# Patient Record
Sex: Female | Born: 1935 | Race: Black or African American | Hispanic: No
Health system: Southern US, Community
[De-identification: ages and names within clinical notes are randomized; demographics above are authoritative.]

## PROBLEM LIST (undated history)

## (undated) DIAGNOSIS — I10 Essential (primary) hypertension: Secondary | ICD-10-CM

---

## 2017-09-28 ENCOUNTER — Other Ambulatory Visit: Payer: Self-pay

## 2017-09-28 ENCOUNTER — Observation Stay: Payer: 59

## 2017-09-28 ENCOUNTER — Observation Stay
Admission: EM | Admit: 2017-09-28 | Discharge: 2017-09-29 | Disposition: A | Discharge status: Home / Self Care | Payer: 59 | Attending: Internal Medicine | Admitting: Internal Medicine

## 2017-09-28 ENCOUNTER — Observation Stay
Admit: 2017-09-28 | Discharge: 2017-09-28 | Disposition: A | Discharge status: Home / Self Care | Payer: 59 | Attending: Internal Medicine | Admitting: Internal Medicine

## 2017-09-28 ENCOUNTER — Encounter: Payer: Self-pay | Admitting: Internal Medicine

## 2017-09-28 DIAGNOSIS — M7121 Synovial cyst of popliteal space [Baker], right knee: Secondary | ICD-10-CM | POA: Insufficient documentation

## 2017-09-28 DIAGNOSIS — J90 Pleural effusion, not elsewhere classified: Secondary | ICD-10-CM | POA: Diagnosis not present

## 2017-09-28 DIAGNOSIS — I700 Atherosclerosis of aorta: Secondary | ICD-10-CM | POA: Insufficient documentation

## 2017-09-28 DIAGNOSIS — R55 Syncope and collapse: Principal | ICD-10-CM | POA: Diagnosis present

## 2017-09-28 DIAGNOSIS — I071 Rheumatic tricuspid insufficiency: Secondary | ICD-10-CM | POA: Diagnosis not present

## 2017-09-28 DIAGNOSIS — I2510 Atherosclerotic heart disease of native coronary artery without angina pectoris: Secondary | ICD-10-CM | POA: Diagnosis not present

## 2017-09-28 DIAGNOSIS — Z7982 Long term (current) use of aspirin: Secondary | ICD-10-CM | POA: Diagnosis not present

## 2017-09-28 DIAGNOSIS — M7122 Synovial cyst of popliteal space [Baker], left knee: Secondary | ICD-10-CM | POA: Insufficient documentation

## 2017-09-28 DIAGNOSIS — I10 Essential (primary) hypertension: Secondary | ICD-10-CM | POA: Insufficient documentation

## 2017-09-28 DIAGNOSIS — R7989 Other specified abnormal findings of blood chemistry: Secondary | ICD-10-CM | POA: Insufficient documentation

## 2017-09-28 DIAGNOSIS — R778 Other specified abnormalities of plasma proteins: Secondary | ICD-10-CM

## 2017-09-28 DIAGNOSIS — I493 Ventricular premature depolarization: Secondary | ICD-10-CM | POA: Diagnosis not present

## 2017-09-28 DIAGNOSIS — I6523 Occlusion and stenosis of bilateral carotid arteries: Secondary | ICD-10-CM | POA: Diagnosis not present

## 2017-09-28 DIAGNOSIS — Z79899 Other long term (current) drug therapy: Secondary | ICD-10-CM | POA: Insufficient documentation

## 2017-09-28 DIAGNOSIS — R748 Abnormal levels of other serum enzymes: Secondary | ICD-10-CM | POA: Insufficient documentation

## 2017-09-28 HISTORY — DX: Essential (primary) hypertension: I10

## 2017-09-28 LAB — ECHOCARDIOGRAM COMPLETE
Height: 64 in
Weight: 2587.2 oz

## 2017-09-28 LAB — CBC WITH DIFFERENTIAL/PLATELET
Basophils Absolute: 0 10*3/uL (ref 0–0.1)
Basophils Relative: 0 %
EOS ABS: 0.2 10*3/uL (ref 0–0.7)
EOS PCT: 3 %
HCT: 34.4 % — ABNORMAL LOW (ref 35.0–47.0)
Hemoglobin: 11.5 g/dL — ABNORMAL LOW (ref 12.0–16.0)
Lymphocytes Relative: 26 %
Lymphs Abs: 1.6 10*3/uL (ref 1.0–3.6)
MCH: 29.5 pg (ref 26.0–34.0)
MCHC: 33.5 g/dL (ref 32.0–36.0)
MCV: 88.1 fL (ref 80.0–100.0)
Monocytes Absolute: 0.6 10*3/uL (ref 0.2–0.9)
Monocytes Relative: 10 %
Neutro Abs: 3.8 10*3/uL (ref 1.4–6.5)
Neutrophils Relative %: 61 %
PLATELETS: 161 10*3/uL (ref 150–440)
RBC: 3.91 MIL/uL (ref 3.80–5.20)
RDW: 15.3 % — AB (ref 11.5–14.5)
WBC: 6.1 10*3/uL (ref 3.6–11.0)

## 2017-09-28 LAB — URINALYSIS, COMPLETE (UACMP) WITH MICROSCOPIC
Bacteria, UA: NONE SEEN
Bilirubin Urine: NEGATIVE
Glucose, UA: NEGATIVE mg/dL
Hgb urine dipstick: NEGATIVE
Ketones, ur: NEGATIVE mg/dL
Nitrite: NEGATIVE
Protein, ur: NEGATIVE mg/dL
Specific Gravity, Urine: 1.015 (ref 1.005–1.030)
pH: 5 (ref 5.0–8.0)

## 2017-09-28 LAB — COMPREHENSIVE METABOLIC PANEL
ALK PHOS: 46 U/L (ref 38–126)
ALT: 11 U/L (ref 0–44)
ANION GAP: 10 (ref 5–15)
AST: 21 U/L (ref 15–41)
Albumin: 3.7 g/dL (ref 3.5–5.0)
BILIRUBIN TOTAL: 0.3 mg/dL (ref 0.3–1.2)
BUN: 25 mg/dL — ABNORMAL HIGH (ref 8–23)
CALCIUM: 9.4 mg/dL (ref 8.9–10.3)
CO2: 24 mmol/L (ref 22–32)
Chloride: 106 mmol/L (ref 98–111)
Creatinine, Ser: 0.71 mg/dL (ref 0.44–1.00)
GFR calc non Af Amer: >60 mL/min (ref >60)
GLUCOSE: 130 mg/dL — AB (ref 70–99)
Potassium: 3.5 mmol/L (ref 3.5–5.1)
Sodium: 140 mmol/L (ref 135–145)
TOTAL PROTEIN: 6.6 g/dL (ref 6.5–8.1)

## 2017-09-28 LAB — FIBRIN DERIVATIVES D-DIMER (ARMC ONLY): FIBRIN DERIVATIVES D-DIMER (ARMC): 4018.56 ng{FEU}/mL — AB (ref 0.00–499.00)

## 2017-09-28 LAB — HEMOGLOBIN A1C
Hgb A1c MFr Bld: 5.3 % (ref 4.8–5.6)
Mean Plasma Glucose: 105.41 mg/dL

## 2017-09-28 LAB — TROPONIN I
TROPONIN I: 0.09 ng/mL — AB (ref <0.03)
TROPONIN I: 0.09 ng/mL — AB (ref <0.03)
Troponin I: 0.1 ng/mL (ref <0.03)
Troponin I: 0.09 ng/mL (ref <0.03)

## 2017-09-28 LAB — TSH: TSH: 5.962 u[IU]/mL — ABNORMAL HIGH (ref 0.350–4.500)

## 2017-09-28 LAB — LIPASE, BLOOD: Lipase: 37 U/L (ref 11–51)

## 2017-09-28 LAB — MAGNESIUM: MAGNESIUM: 1.8 mg/dL (ref 1.7–2.4)

## 2017-09-28 MED ORDER — OMEGA-3-ACID ETHYL ESTERS 1 G PO CAPS
1.0000 g | ORAL_CAPSULE | Freq: Every day | ORAL | Status: DC
  Administered 2017-09-28 – 2017-09-29 (×2): 1 g via ORAL
  Filled 2017-09-28 (×2): qty 1

## 2017-09-28 MED ORDER — METOPROLOL SUCCINATE ER 50 MG PO TB24
50.0000 mg | ORAL_TABLET | Freq: Every day | ORAL | Status: DC
  Administered 2017-09-28 – 2017-09-29 (×2): 50 mg via ORAL
  Filled 2017-09-28 (×2): qty 1

## 2017-09-28 MED ORDER — ONDANSETRON HCL 4 MG/2ML IJ SOLN: 4.0000 mg | Freq: Four times a day (QID) | INTRAMUSCULAR | Status: DC | PRN

## 2017-09-28 MED ORDER — ONDANSETRON HCL 4 MG PO TABS: 4.0000 mg | ORAL_TABLET | Freq: Four times a day (QID) | ORAL | Status: DC | PRN

## 2017-09-28 MED ORDER — ASPIRIN 81 MG PO CHEW
324.0000 mg | CHEWABLE_TABLET | Freq: Once | ORAL | Status: AC
  Administered 2017-09-28: 324 mg via ORAL
  Filled 2017-09-28: qty 4

## 2017-09-28 MED ORDER — ASPIRIN EC 81 MG PO TBEC
81.0000 mg | DELAYED_RELEASE_TABLET | Freq: Every day | ORAL | Status: DC
  Administered 2017-09-28 – 2017-09-29 (×2): 81 mg via ORAL
  Filled 2017-09-28 (×2): qty 1

## 2017-09-28 MED ORDER — DOCUSATE SODIUM 100 MG PO CAPS
100.0000 mg | ORAL_CAPSULE | Freq: Two times a day (BID) | ORAL | Status: DC
  Administered 2017-09-28 – 2017-09-29 (×2): 100 mg via ORAL
  Filled 2017-09-28 (×3): qty 1

## 2017-09-28 MED ORDER — SODIUM CHLORIDE 0.9 % IV BOLUS
1000.0000 mL | Freq: Once | INTRAVENOUS | Status: AC
  Administered 2017-09-28: 1000 mL via INTRAVENOUS

## 2017-09-28 MED ORDER — VITAMIN D3 25 MCG (1000 UNIT) PO TABS
1000.0000 [IU] | ORAL_TABLET | Freq: Every day | ORAL | Status: DC
  Administered 2017-09-28 – 2017-09-29 (×2): 1000 [IU] via ORAL
  Filled 2017-09-28 (×2): qty 1

## 2017-09-28 MED ORDER — ACETAMINOPHEN 325 MG PO TABS: 650.0000 mg | ORAL_TABLET | Freq: Four times a day (QID) | ORAL | Status: DC | PRN

## 2017-09-28 MED ORDER — ADULT MULTIVITAMIN W/MINERALS CH
1.0000 | ORAL_TABLET | Freq: Every day | ORAL | Status: DC
  Administered 2017-09-28 – 2017-09-29 (×2): 1 via ORAL
  Filled 2017-09-28 (×2): qty 1

## 2017-09-28 MED ORDER — VITAMIN C 500 MG PO TABS
500.0000 mg | ORAL_TABLET | Freq: Every day | ORAL | Status: DC
  Administered 2017-09-28 – 2017-09-29 (×2): 500 mg via ORAL
  Filled 2017-09-28 (×2): qty 1

## 2017-09-28 MED ORDER — ENOXAPARIN SODIUM 40 MG/0.4ML ~~LOC~~ SOLN
40.0000 mg | SUBCUTANEOUS | Status: DC
  Administered 2017-09-28: 40 mg via SUBCUTANEOUS
  Filled 2017-09-28: qty 0.4

## 2017-09-28 MED ORDER — ACETAMINOPHEN 650 MG RE SUPP: 650.0000 mg | Freq: Four times a day (QID) | RECTAL | Status: DC | PRN

## 2017-09-28 NOTE — ED Triage Notes (Signed)
Pt in via [facility] from home, pt is here on vacation.  While eating, pt with two syncopal episodes, hitting head on wall, picture falling off wall onto head as well.  Pt A/Ox4, vitals WDL, NAD noted at this time.

## 2017-09-28 NOTE — Progress Notes (Signed)
The patient has no complaints after admission.  Her vital signs are stable. Echocardiograph and possible stress test per Dr. Lady GaryFath. Continue current treatment.  I discussed with the patient and RN.  Time spent about 26 minutes.

## 2017-09-28 NOTE — Progress Notes (Signed)
*  PRELIMINARY RESULTS* Echocardiogram 2D Echocardiogram has been performed.  Kristen GulaJoan M Heron Conway 09/28/2017, 12:17 PM

## 2017-09-28 NOTE — Progress Notes (Signed)
Patient was transferred from the ER following a syncope episode at home. On admission patient was A&O X4,ambulatory and denied being in pain or any discomfort. Patient was accompanied by family member. Patient was oriented to her room, placed on the cardiac monitor and care plan review with patient and family. Patient has no acute event overnight.

## 2017-09-28 NOTE — ED Provider Notes (Signed)
[facility] Emergency Department Provider Note  ____________________________________________  Time seen: Approximately 1:04 AM  I have reviewed the triage vital signs and the nursing notes.   HISTORY  Chief Complaint Loss of Consciousness    HPI Kristen Conway is a 84 y.o. female with a history of hypertension who was sitting at home eating brownies in a chair when she suddenly passed out.  She denies any preceding symptoms, no chest pain shortness of breath back pain or abdominal pain.  No headache vision changes numbness tingling weakness.  She fell back and hit her head on the wall.  She regained consciousness within just a few minutes, and family reports that she never actually was fully unconscious but just very dazed and confused for a period of time.  Patient denies any symptoms after the syncope.  Still no headache, no nausea vomiting vision changes weakness or paresthesia.  Symptoms are intermittent without aggravating or alleviating factors, no apparent trigger.  Now resolved.  Feels back to normal.      Past medical history Hypertension  There are no active problems to display for this patient.      Prior to Admission medications   Not on File  Metoprolol Multivitamins   Allergies Patient has no known allergies.   No family history on file.  Social History Social History   Tobacco Use  . Smoking status: Not on file  Substance Use Topics  . Alcohol use: Not on file  . Drug use: Not on file  No tobacco or alcohol use  Review of Systems  Constitutional:   No fever or chills.  ENT:   No sore throat. No rhinorrhea. Cardiovascular:   No chest pain, positive syncope. Respiratory:   No dyspnea or cough. Gastrointestinal:   Negative for abdominal pain, vomiting and diarrhea.  Musculoskeletal:   Negative for focal pain or swelling All other systems reviewed and are negative except as documented above in ROS and  HPI.  ____________________________________________   PHYSICAL EXAM:  VITAL SIGNS: ED Triage Vitals  Enc Vitals Group     BP 09/28/17 0014 (!) 168/80     Pulse Rate 09/28/17 0014 92     Resp 09/28/17 0014 16     Temp 09/28/17 0014 98.2 F (36.8 C)     Temp Source 09/28/17 0014 Oral     SpO2 09/28/17 0014 98 %     Weight 09/28/17 0015 162 lb (73.5 kg)     Height 09/28/17 0015 5\' 4"  (1.626 m)     Head Circumference --      Peak Flow --      Pain Score 09/28/17 0015 0     Pain Loc --      Pain Edu? --      Excl. in GC? --     Vital signs reviewed, nursing assessments reviewed.   Constitutional:   Alert and oriented. Non-toxic appearance. Eyes:   Conjunctivae are normal. EOMI. PERRL. ENT      Head:   Normocephalic and atraumatic.      Nose:   No congestion/rhinnorhea.       Mouth/Throat:   MMM, no pharyngeal erythema. No peritonsillar mass.       Neck:   No meningismus. Full ROM. Hematological/Lymphatic/Immunilogical:   No cervical lymphadenopathy. Cardiovascular:   RRR. Symmetric bilateral radial and DP pulses.  No murmurs.  Respiratory:   Normal respiratory effort without tachypnea/retractions. Breath sounds are clear and equal bilaterally. No wheezes/rales/rhonchi. Gastrointestinal:  Soft and nontender. Non distended. There is no CVA tenderness.  No rebound, rigidity, or guarding. Genitourinary:   deferred Musculoskeletal:   Normal range of motion in all extremities. No joint effusions.  No lower extremity tenderness.  No edema.  No midline spinal tenderness  neurologic:   Normal speech and language.  Motor grossly intact. Normal cerebellar function No acute focal neurologic deficits are appreciated.  Skin:    Skin is warm, dry and intact. No rash noted.  No petechiae, purpura, or bullae.  ____________________________________________    LABS (pertinent positives/negatives) (all labs ordered are listed, but only abnormal results are displayed) Labs Reviewed   COMPREHENSIVE METABOLIC PANEL - Abnormal; Notable for the following components:      Result Value   Glucose, Bld 130 (*)    BUN 25 (*)    All other components within normal limits  TROPONIN I - Abnormal; Notable for the following components:   Troponin I 0.09 (*)    All other components within normal limits  CBC WITH DIFFERENTIAL/PLATELET - Abnormal; Notable for the following components:   Hemoglobin 11.5 (*)    HCT 34.4 (*)    RDW 15.3 (*)    All other components within normal limits  LIPASE, BLOOD  URINALYSIS, COMPLETE (UACMP) WITH MICROSCOPIC  TROPONIN I   ____________________________________________   EKG  Interpreted by me Sinus rhythm rate of 79, normal axis and intervals.  Normal QRS ST segments and T waves.  ____________________________________________    RADIOLOGY  No results found.  ____________________________________________   PROCEDURES Procedures  ____________________________________________  DIFFERENTIAL DIAGNOSIS   Vasovagal syncope, dehydration.  UTI.  A low suspicion for ACS PE dissection AAA mesenteric ischemia biliary disease or pneumonia.  Patient is not septic.  Doubt intracranial hemorrhage stroke or C-spine injury.  CLINICAL IMPRESSION / ASSESSMENT AND PLAN / ED COURSE  Pertinent labs & imaging results that were available during my care of the patient were reviewed by me and considered in my medical decision making (see chart for details).    Patient well-appearing and back to normal.  No red flag symptoms.  Check labs including urine.  IV fluids for hydration.  EKG is unremarkable.  Clinical Course as of Sep 29 303  Thu Sep 28, 2017  0302 CO2: 24 [PS]    Clinical Course User Index [PS] Sharman CheekStafford, Janeshia Ciliberto, MD     ----------------------------------------- 3:05 AM on 09/28/2017 -----------------------------------------  Labs show a troponin of 0.09.  In the setting of syncope, age and comorbidities, concerning for underlying  cardiac event such as a silent non-STEMI.  Discussed with hospitalist for further evaluation.  Patient remains asymptomatic with stable vital signs.  I will order her 324 mg aspirin.  ____________________________________________   FINAL CLINICAL IMPRESSION(S) / ED DIAGNOSES    Final diagnoses:  Syncope, unspecified syncope type  Elevated troponin     ED Discharge Orders    None      Portions of this note were generated with dragon dictation software. Dictation errors may occur despite best attempts at proofreading.    Sharman CheekStafford, Valery Amedee, MD 09/28/17 [phone]

## 2017-09-28 NOTE — H&P (Signed)
Kristen Conway is an 84 y.o. female.   Chief Complaint: Loss of consciousness HPI: The patient with past medical history of hypertension presents to the emergency department via EMS after an episode of fainting.  The patient was sitting upright on a bench when her family reports that her eyes rolled into her head and her head snapped backward.  Her head hit the wall with enough force to knock a picture to the floor that was hanging above the couch.  The patient slumped in the chair but did not fall as her family members helped her to the floor.  She began to shake quickly regained consciousness although she was groggy.  The patient did have an episode of nausea nonbilious nonbloody emesis.  First responders arrived and the patient apparently had another episode of loss of consciousness.  This happened before but without the seizure-like activity.  On previous occasions the patient was also sitting.  She denies any prodrome to these codes.  She currently denies chest pain, palpitations, shortness of breath, nausea, vomiting or diaphoresis.  Due to unexplained syncope the emergency department staff called the hospitalist service for admission.  Past Medical History:  Diagnosis Date  . HTN (hypertension)     History reviewed. No pertinent surgical history. Reports no organs removed  Family History  Problem Relation Age of Onset  . Congestive Heart Failure Son    Social History:  has no tobacco, alcohol, and drug history on file.  Allergies: No Known Allergies  Medications Prior to Admission  Medication Sig Dispense Refill  . aspirin 81 MG tablet Take 81 mg by mouth daily.     . Cholecalciferol (VITAMIN D3) 1000 units CAPS Take 1,000 Units by mouth daily.     . metoprolol succinate (TOPROL-XL) 50 MG 24 hr tablet Take 50 mg by mouth daily.  3  . Multiple Vitamin (MULTIVITAMIN) capsule Take 1 capsule by mouth daily.     . Omega-3 Fatty Acids (FISH OIL) 1000 MG CAPS Take 1 capsule by mouth daily.      . vitamin C (ASCORBIC ACID) 500 MG tablet Take 500 mg by mouth daily.      Results for orders placed or performed during the hospital encounter of 09/28/17 (from the past 48 hour(s))  Comprehensive metabolic panel     Status: Abnormal   Collection Time: 09/28/17 12:21 AM  Result Value Ref Range   Sodium 140 135 - 145 mmol/L   Potassium 3.5 3.5 - 5.1 mmol/L   Chloride 106 98 - 111 mmol/L    Comment: Please note change in reference range.   CO2 24 22 - 32 mmol/L   Glucose, Bld 130 (H) 70 - 99 mg/dL    Comment: Please note change in reference range.   BUN 25 (H) 8 - 23 mg/dL    Comment: Please note change in reference range.   Creatinine, Ser 0.71 0.44 - 1.00 mg/dL   Calcium 9.4 8.9 - 10.3 mg/dL   Total Protein 6.6 6.5 - 8.1 g/dL   Albumin 3.7 3.5 - 5.0 g/dL   AST 21 15 - 41 U/L   ALT 11 0 - 44 U/L    Comment: Please note change in reference range.   Alkaline Phosphatase 46 38 - 126 U/L   Total Bilirubin 0.3 0.3 - 1.2 mg/dL   GFR calc non Af Amer >60 >60 mL/min   GFR calc Af Amer >60 >60 mL/min    Comment: (NOTE) The eGFR has been calculated using  the CKD EPI equation. This calculation has not been validated in all clinical situations. eGFR's persistently <60 mL/min signify possible Chronic Kidney Disease.    Anion gap 10 5 - 15    Comment: Performed at [facility], [address] [place], [place] [address]  Lipase, blood     Status: None   Collection Time: 09/28/17 12:21 AM  Result Value Ref Range   Lipase 37 11 - 51 U/L    Comment: Performed at [facility], [address] [place], [place] [address]  Troponin I     Status: Abnormal   Collection Time: 09/28/17 12:21 AM  Result Value Ref Range   Troponin I 0.09 (HH) <0.03 ng/mL    Comment: CRITICAL RESULT CALLED TO, READ BACK BY AND VERIFIED WITH BUTCH WOODS ON 09/28/17 AT 0116 JAG Performed at [facility], [address] [place], [place] [address]   CBC with Differential     Status:  Abnormal   Collection Time: 09/28/17 12:21 AM  Result Value Ref Range   WBC 6.1 3.6 - 11.0 K/uL   RBC 3.91 3.80 - 5.20 MIL/uL   Hemoglobin 11.5 (L) 12.0 - 16.0 g/dL   HCT 34.4 (L) 35.0 - 47.0 %   MCV 88.1 80.0 - 100.0 fL   MCH 29.5 26.0 - 34.0 pg   MCHC 33.5 32.0 - 36.0 g/dL   RDW 15.3 (H) 11.5 - 14.5 %   Platelets 161 150 - 440 K/uL   Neutrophils Relative % 61 %   Neutro Abs 3.8 1.4 - 6.5 K/uL   Lymphocytes Relative 26 %   Lymphs Abs 1.6 1.0 - 3.6 K/uL   Monocytes Relative 10 %   Monocytes Absolute 0.6 0.2 - 0.9 K/uL   Eosinophils Relative 3 %   Eosinophils Absolute 0.2 0 - 0.7 K/uL   Basophils Relative 0 %   Basophils Absolute 0.0 0 - 0.1 K/uL    Comment: Performed at [facility], [address] [place], [place] [address]  Troponin I     Status: Abnormal   Collection Time: 09/28/17  3:46 AM  Result Value Ref Range   Troponin I 0.09 (HH) <0.03 ng/mL    Comment: CRITICAL VALUE NOTED. VALUE IS CONSISTENT WITH PREVIOUSLY REPORTED/CALLED VALUE / JAG Performed at [facility], [address] [place], [place] [address]    No results found.  Review of Systems  Constitutional: Negative for chills and fever.  HENT: Negative for sore throat and tinnitus.   Eyes: Negative for blurred vision and redness.  Respiratory: Negative for cough and shortness of breath.   Cardiovascular: Negative for chest pain, palpitations, orthopnea and PND.  Gastrointestinal: Negative for abdominal pain, diarrhea, nausea and vomiting.  Genitourinary: Negative for dysuria, frequency and urgency.  Musculoskeletal: Negative for joint pain and myalgias.  Skin: Negative for rash.       No lesions  Neurological: Positive for loss of consciousness. Negative for speech change, focal weakness and weakness.  Endo/Heme/Allergies: Does not bruise/bleed easily.       No temperature intolerance  Psychiatric/Behavioral: Negative for depression and suicidal ideas.    Blood pressure (!)  163/93, pulse 93, temperature 99 F (37.2 C), temperature source Oral, resp. rate 17, height 5' 4"  (1.626 m), weight 73.3 kg (161 lb 11.2 oz), SpO2 100 %. Physical Exam  Vitals reviewed. Constitutional: She is oriented to person, place, and time. She appears well-developed and well-nourished. No distress.  HENT:  Head: Normocephalic and atraumatic.  Mouth/Throat: Oropharynx is clear and moist.  Eyes: Pupils are equal, round, and reactive to light. Conjunctivae and EOM are normal. No scleral icterus.  Neck: Normal range of motion. Neck supple. No JVD present. No tracheal deviation present. No thyromegaly present.  Cardiovascular: Normal rate, regular rhythm and normal heart sounds. Exam reveals no gallop and no friction rub.  No murmur heard. Respiratory: Effort normal and breath sounds normal.  GI: Soft. Bowel sounds are normal. She exhibits no distension. There is no tenderness.  Genitourinary:  Genitourinary Comments: Deferred  Musculoskeletal: Normal range of motion. She exhibits edema (1+).  Lymphadenopathy:    She has no cervical adenopathy.  Neurological: She is alert and oriented to person, place, and time. No cranial nerve deficit. She exhibits normal muscle tone.  Skin: Skin is warm and dry. No rash noted. No erythema.  Psychiatric: She has a normal mood and affect. Her behavior is normal. Judgment and thought content normal.     Assessment/Plan This is an 84 year old female admitted for syncope. 1.  Syncope: Unprovoked; rule out arrhythmia.  Examination of telemetry while at bedside shows intermittent sinus pauses.  The patient also has multifocal P waves as well as some atrial tachycardia.  Multiple PVCs also shown.  All of these may lead to cardiogenic syncope.  No significant aortic stenosis heard on physical exam but I will obtain an echocardiogram.  Also consult cardiology.  Consult neurology at the discretion of the primary team. 2.  Elevated troponin: EKG does not indicate  myocardial ischemia.  Etiology may be secondary to seizure-like activity and/or transiently impaired renal clearance. 3.  Hypertension: Acceptable for age; assess risk versus benefit of increasing metoprolol dose. 4.  DVT prophylaxis: Lovenox 5.  GI prophylaxis: None The patient is a full code.  Time spent on admission orders and patient care approximately 45 minutes  Harrie Foreman, MD 09/28/2017, 6:08 AM

## 2017-09-28 NOTE — Consult Note (Signed)
Cardiology Consultation Note    Patient ID: Kristen Conway, MRN: [id number], DOB/AGE: 1926-05-07 84 y.o. Admit date: 09/28/2017   Date of Consult: 09/28/2017 Primary Physician: System, Pcp Not In Primary Cardiologist: Out of town  Chief Complaint: hypertension Reason for Consultation: elevated troponin Requesting MD: Dr. Imogene Burn  HPI: Kristen Conway is a 84 y.o. female with history of hypertension followed in the [place] area.  She is currently on benazepril-hydrochlorothiazide to 20-25 mg daily, metoprolol succinate 50 mg daily.  She went to the emergency room with apparent loss of consciousness.  She apparently was sitting upright on a bench when her eyes rolled to the back of her head and she fell backwards hitting the wall.  She slumped to the floor but did not fall as she was helped to the floor by family members.  Patient apparently had another episode of loss of consciousness when first responders were present.  Patient had no chest pain shortness of breath nausea or vomiting.  Serum troponin was borderline elevated at 0.09 and was flat and did not rise.  Electrocardiogram revealed sinus rhythm with PACs.  There were no ischemic changes.  View of notes in care everywhere suggest the patient had what was felt to be a vasovagal syncopal episode in 2017 while on a trip in [place].  Work-up at that time revealed a head CT, chest x-ray, brain MRI, carotid Dopplers which were all unremarkable.  Past Medical History:  Diagnosis Date  . HTN (hypertension)       Surgical History: History reviewed. No pertinent surgical history.   Home Meds: Prior to Admission medications   Medication Sig Start Date End Date Taking? Authorizing Provider  aspirin 81 MG tablet Take 81 mg by mouth daily.  01/16/12  Yes [provider]  Cholecalciferol (VITAMIN D3) 1000 units CAPS Take 1,000 Units by mouth daily.    Yes [provider]  metoprolol succinate (TOPROL-XL) 50 MG 24 hr tablet Take  50 mg by mouth daily. 08/29/17  Yes [provider]  Multiple Vitamin (MULTIVITAMIN) capsule Take 1 capsule by mouth daily.    Yes [provider]  Omega-3 Fatty Acids (FISH OIL) 1000 MG CAPS Take 1 capsule by mouth daily.    Yes [provider]  vitamin C (ASCORBIC ACID) 500 MG tablet Take 500 mg by mouth daily.   Yes [provider]    Inpatient Medications:  . aspirin EC  81 mg Oral Daily  . cholecalciferol  1,000 Units Oral Daily  . docusate sodium  100 mg Oral BID  . enoxaparin (LOVENOX) injection  40 mg Subcutaneous Q24H  . metoprolol succinate  50 mg Oral Daily  . multivitamin with minerals  1 tablet Oral Daily  . omega-3 acid ethyl esters  1 g Oral Daily  . vitamin C  500 mg Oral Daily     Allergies: No Known Allergies  Social History   Socioeconomic History  . Marital status: Widowed    Spouse name: Not on file  . Number of children: Not on file  . Years of education: Not on file  . Highest education level: Not on file  Occupational History  . Not on file  Social Needs  . Financial resource strain: Not on file  . Food insecurity:    Worry: Not on file    Inability: Not on file  . Transportation needs:    Medical: Not on file    Non-medical: Not on file  Tobacco  Use  . Smoking status: Not on file  Substance and Sexual Activity  . Alcohol use: Not on file  . Drug use: Not on file  . Sexual activity: Not on file  Lifestyle  . Physical activity:    Days per week: Not on file    Minutes per session: Not on file  . Stress: Not on file  Relationships  . Social connections:    Talks on phone: Not on file    Gets together: Not on file    Attends religious service: Not on file    Active member of club or organization: Not on file    Attends meetings of clubs or organizations: Not on file    Relationship status: Not on file  . Intimate partner violence:    Fear of current or ex partner: Not on file    Emotionally abused: Not  on file    Physically abused: Not on file    Forced sexual activity: Not on file  Other Topics Concern  . Not on file  Social History Narrative  . Not on file     Family History  Problem Relation Age of Onset  . Congestive Heart Failure Son      Review of Systems: A 12-system review of systems was performed and is negative except as noted in the HPI.  Labs: Recent Labs    09/28/17 0021 09/28/17 0346  TROPONINI 0.09* 0.09*   Lab Results  Component Value Date   WBC 6.1 09/28/2017   HGB 11.5 (L) 09/28/2017   HCT 34.4 (L) 09/28/2017   MCV 88.1 09/28/2017   PLT 161 09/28/2017    Recent Labs  Lab 09/28/17 0021  NA 140  K 3.5  CL 106  CO2 24  BUN 25*  CREATININE 0.71  CALCIUM 9.4  PROT 6.6  BILITOT 0.3  ALKPHOS 46  ALT 11  AST 21  GLUCOSE 130*   No results found for: CHOL, HDL, LDLCALC, TRIG No results found for: DDIMER  Radiology/Studies:  No results found.  Wt Readings from Last 3 Encounters:  09/28/17 73.3 kg (161 lb 11.2 oz)    EKG: Normal sinus rhythm with PACs  Physical Exam:  Blood pressure (!) 121/59, pulse 97, temperature 97.8 F (36.6 C), temperature source Oral, resp. rate 18, height 5\' 4"  (1.626 m), weight 73.3 kg (161 lb 11.2 oz), SpO2 99 %. Body mass index is 27.76 kg/m. General: Well developed, well nourished, in no acute distress. Head: Normocephalic, atraumatic, sclera non-icteric, no xanthomas, nares are without discharge.  Neck: Negative for carotid bruits. JVD not elevated. Lungs: Clear bilaterally to auscultation without wheezes, rales, or rhonchi. Breathing is unlabored. Heart: RRR with S1 S2. No murmurs, rubs, or gallops appreciated. Abdomen: Soft, non-tender, non-distended with normoactive bowel sounds. No hepatomegaly. No rebound/guarding. No obvious abdominal masses. Msk:  Strength and tone appear normal for age. Extremities: No clubbing or cyanosis. No edema.  Distal pedal pulses are 2+ and equal bilaterally. Neuro: Alert  and oriented X 3. No facial asymmetry. No focal deficit. Moves all extremities spontaneously. Psych:  Responds to questions appropriately with a normal affect.     Assessment and Plan  84 year old female with history of previous what was felt to be vasovagal syncope admitted after 2 episodes of loss of consciousness.  Patient has had a borderline troponin elevation of 0.09.  Echocardiogram is pending.  Patient has no clinical evidence of significant valvular abnormalities.  Patient states she was sitting after eating some brownies  on a bench and then was noted to fall back hit her head on the wall behind her.  She denied any preceding chest pain or shortness of breath.  She remains reasonably active and [place].  We will review her echo.  Her troponin does not appear to represent ischemia.  Will evaluate for wall motion and normality consider functional study in the morning.  Will follow for further arrhythmias.  The recommendations after this is complete.  If echo does not show wall motion maladies, will do an Lexiscan sestamibi in the morning.  Would defer this if her troponin rises.  Signed, Dalia HeadingKenneth A Donisha Hoch MD 09/28/2017, 11:35 AM Pager: [phone]

## 2017-09-28 NOTE — Plan of Care (Signed)
  Problem: Education: Goal: Knowledge of General Education information will improve Outcome: Progressing   Problem: Health Behavior/Discharge Planning: Goal: Ability to manage health-related needs will improve Outcome: Progressing   Problem: Activity: Goal: Risk for activity intolerance will decrease Outcome: Progressing   Problem: Nutrition: Goal: Adequate nutrition will be maintained Outcome: Progressing   Problem: Safety: Goal: Ability to remain free from injury will improve Outcome: Progressing   Problem: Skin Integrity: Goal: Risk for impaired skin integrity will decrease Outcome: Progressing   

## 2017-09-28 NOTE — Care Management Obs Status (Signed)
MEDICARE OBSERVATION STATUS NOTIFICATION   Patient Details  Name: Kristen Conway MRN: [id number] Date of Birth: 08/10/1926   Medicare Observation Status Notification Given:  Yes    Eber HongGreene, Brandice Busser R, RN 09/28/2017, 4:55 PM

## 2017-09-28 NOTE — ED Notes (Signed)
Dr Scotty CourtStafford made aware aty this time of pt's elevated Troponin level as reported by lab: Troponin 0.09ng/mL

## 2017-09-29 ENCOUNTER — Observation Stay: Payer: 59

## 2017-09-29 ENCOUNTER — Encounter: Payer: Self-pay | Admitting: *Deleted

## 2017-09-29 LAB — BASIC METABOLIC PANEL
Anion gap: 6 (ref 5–15)
BUN: 18 mg/dL (ref 8–23)
CALCIUM: 8.5 mg/dL — AB (ref 8.9–10.3)
CO2: 25 mmol/L (ref 22–32)
Chloride: 108 mmol/L (ref 98–111)
Creatinine, Ser: 0.7 mg/dL (ref 0.44–1.00)
GFR calc Af Amer: >60 mL/min (ref >60)
GLUCOSE: 85 mg/dL (ref 70–99)
Potassium: 3.9 mmol/L (ref 3.5–5.1)
SODIUM: 139 mmol/L (ref 135–145)

## 2017-09-29 LAB — TROPONIN I: Troponin I: 0.09 ng/mL (ref <0.03)

## 2017-09-29 LAB — PROTIME-INR
INR: 1.02
Prothrombin Time: 13.3 seconds (ref 11.4–15.2)

## 2017-09-29 LAB — APTT: APTT: 35 s (ref 24–36)

## 2017-09-29 MED ORDER — HEPARIN BOLUS VIA INFUSION
2500.0000 [IU] | Freq: Once | INTRAVENOUS | Status: AC
  Administered 2017-09-29: 2500 [IU] via INTRAVENOUS
  Filled 2017-09-29: qty 2500

## 2017-09-29 MED ORDER — IOHEXOL 350 MG/ML SOLN
75.0000 mL | Freq: Once | INTRAVENOUS | Status: AC | PRN
  Administered 2017-09-29: 75 mL via INTRAVENOUS

## 2017-09-29 MED ORDER — HEPARIN (PORCINE) IN NACL 100-0.45 UNIT/ML-% IJ SOLN
1200.0000 [IU]/h | INTRAMUSCULAR | Status: DC
  Administered 2017-09-29: 1200 [IU]/h via INTRAVENOUS
  Filled 2017-09-29: qty 250

## 2017-09-29 NOTE — Progress Notes (Signed)
ANTICOAGULATION CONSULT NOTE  Pharmacy Consult for Heparin drip Indication: possible pulmonary embolus and DVT  No Known Allergies  Patient Measurements: Height: 5\' 4"  (162.6 cm) Weight: 161 lb 14.4 oz (73.4 kg) IBW/kg (Calculated) : 54.7 Heparin Dosing Weight: 70 kg  Vital Signs: Temp: 98.5 F (36.9 C) (06/28 0730) Temp Source: Oral (06/28 0730) BP: 160/87 (06/28 0825) Pulse Rate: 71 (06/28 0825)  Labs: Recent Labs    09/28/17 0021  09/28/17 1605 09/28/17 2217 09/29/17 0353  HGB 11.5*  --   --   --   --   HCT 34.4*  --   --   --   --   PLT 161  --   --   --   --   CREATININE 0.71  --   --   --  0.70  TROPONINI 0.09*   < > 0.10* 0.09* 0.09*   < > = values in this interval not displayed.    Estimated Creatinine Clearance: 47.7 mL/min (by C-G formula based on SCr of 0.7 mg/dL).   Medical History: Past Medical History:  Diagnosis Date  . HTN (hypertension)    Assessment: 84 y/o F admitted with syncope and found to have significantly elevated d-dimer to begin heparin infusion for possible DVT/PE. Patient received Lovenox 40 mg last PM.   Goal of Therapy:  Heparin level 0.3-0.7 units/ml Monitor platelets by anticoagulation protocol: Yes   Plan:  Give 2500 units bolus x 1 (half-bolus) Start heparin infusion at 1200 units/hr Check anti-Xa level in 6 hours and daily while on heparin Continue to monitor H&H and platelets  Luisa HartChristy, Saanvika Vazques D 09/29/2017,9:37 AM

## 2017-09-29 NOTE — Discharge Summary (Signed)
[facility] - [place] at [facility]   PATIENT NAME: Kristen Conway    MR#:  [id number]  DATE OF BIRTH:  01-21-1927  DATE OF ADMISSION:  09/28/2017   ADMITTING PHYSICIAN: Arnaldo Natal, MD  DATE OF DISCHARGE: 09/29/2017  PRIMARY CARE PHYSICIAN: System, Pcp Not In   ADMISSION DIAGNOSIS:  Elevated troponin [R74.8] Syncope, unspecified syncope type [R55] DISCHARGE DIAGNOSIS:  Active Problems:   Syncope  SECONDARY DIAGNOSIS:   Past Medical History:  Diagnosis Date  . HTN (hypertension)    HOSPITAL COURSE:  This is an 84 year old female admitted for syncope. 1.  Syncope: unclear etiology; No arrhythmia.   Echo and carotid US are unremarkable.  2.  Elevated troponin: EKG does not indicate myocardial ischemia.  Continue ASA. Echo and carotid US are unremarkable. Per Dr. Lady Gary, follow PCP in [place].  3.  Hypertension: continue metoprolol.  4. Elevated D-Dimer. No PE or DVT per CT angio and venous US.  I discussed with Dr. Lady Gary. DISCHARGE CONDITIONS:  Stable, discharge to home today. CONSULTS OBTAINED:  Treatment Team:  Alwyn Pea, MD Dalia Heading, MD DRUG ALLERGIES:  No Known Allergies DISCHARGE MEDICATIONS:   Allergies as of 09/29/2017   No Known Allergies     Medication List    TAKE these medications   aspirin 81 MG tablet Take 81 mg by mouth daily.   Fish Oil 1000 MG Caps Take 1 capsule by mouth daily.   metoprolol succinate 50 MG 24 hr tablet Commonly known as:  TOPROL-XL Take 50 mg by mouth daily.   multivitamin capsule Take 1 capsule by mouth daily.   vitamin C 500 MG tablet Commonly known as:  ASCORBIC ACID Take 500 mg by mouth daily.   Vitamin D3 1000 units Caps Take 1,000 Units by mouth daily.        DISCHARGE INSTRUCTIONS:  See AVS.  If you experience worsening of your admission symptoms, develop shortness of breath, life threatening emergency, suicidal or homicidal thoughts you must seek medical  attention immediately by calling 911 or calling your MD immediately  if symptoms less severe.  You Must read complete instructions/literature along with all the possible adverse reactions/side effects for all the Medicines you take and that have been prescribed to you. Take any new Medicines after you have completely understood and accpet all the possible adverse reactions/side effects.   Please note  You were cared for by a hospitalist during your hospital stay. If you have any questions about your discharge medications or the care you received while you were in the hospital after you are discharged, you can call the unit and asked to speak with the hospitalist on call if the hospitalist that took care of you is not available. Once you are discharged, your primary care physician will handle any further medical issues. Please note that NO REFILLS for any discharge medications will be authorized once you are discharged, as it is imperative that you return to your primary care physician (or establish a relationship with a primary care physician if you do not have one) for your aftercare needs so that they can reassess your need for medications and monitor your lab values.    On the day of Discharge:  VITAL SIGNS:  Blood pressure (!) 160/87, pulse 71, temperature 98.5 F (36.9 C), temperature source Oral, resp. rate 18, height 5\' 4"  (1.626 m), weight 161 lb 14.4 oz (73.4 kg), SpO2 99 %. PHYSICAL EXAMINATION:  GENERAL:  84 y.o.-year-old patient  lying in the bed with no acute distress.  EYES: Pupils equal, round, reactive to light and accommodation. No scleral icterus. Extraocular muscles intact.  HEENT: Head atraumatic, normocephalic. Oropharynx and nasopharynx clear.  NECK:  Supple, no jugular venous distention. No thyroid enlargement, no tenderness.  LUNGS: Normal breath sounds bilaterally, no wheezing, rales,rhonchi or crepitation. No use of accessory muscles of respiration.  CARDIOVASCULAR: S1,  S2 normal. No murmurs, rubs, or gallops.  ABDOMEN: Soft, non-tender, non-distended. Bowel sounds present. No organomegaly or mass.  EXTREMITIES: No pedal edema, cyanosis, or clubbing.  NEUROLOGIC: Cranial nerves II through XII are intact. Muscle strength 5/5 in all extremities. Sensation intact. Gait not checked.  PSYCHIATRIC: The patient is alert and oriented x 3.  SKIN: No obvious rash, lesion, or ulcer.  DATA REVIEW:   CBC Recent Labs  Lab 09/28/17 0021  WBC 6.1  HGB 11.5*  HCT 34.4*  PLT 161    Chemistries  Recent Labs  Lab 09/28/17 0021 09/28/17 0346 09/29/17 0353  NA 140  --  139  K 3.5  --  3.9  CL 106  --  108  CO2 24  --  25  GLUCOSE 130*  --  85  BUN 25*  --  18  CREATININE 0.71  --  0.70  CALCIUM 9.4  --  8.5*  MG  --  1.8  --   AST 21  --   --   ALT 11  --   --   ALKPHOS 46  --   --   BILITOT 0.3  --   --      Microbiology Results  No results found for this or any previous visit.  RADIOLOGY:  Ct Angio Chest Pe W Or Wo Contrast  Result Date: 09/29/2017 CLINICAL DATA:  Syncopal episode 1 day ago. EXAM: CT ANGIOGRAPHY CHEST WITH CONTRAST TECHNIQUE: Multidetector CT imaging of the chest was performed using the standard protocol during bolus administration of intravenous contrast. Multiplanar CT image reconstructions and MIPs were obtained to evaluate the vascular anatomy. CONTRAST:  75mL OMNIPAQUE IOHEXOL 350 MG/ML SOLN COMPARISON:  None. FINDINGS: Cardiovascular: Pulmonary arteries are well opacified by contrast bolus. There is no acute pulmonary embolus. Bovine arch anatomy. There is atherosclerotic calcification of the thoracic aorta. No aneurysm. The heart size is normal. There is focal atherosclerotic calcification of the coronary arteries. Mediastinum/Nodes: The visualized portion of the thyroid gland has a normal appearance. Esophagus is normal. No mediastinal, hilar, or axillary adenopathy. Lungs/Pleura: There are trace bilateral pleural effusions. Lungs  are clear. Airways are patent. Upper Abdomen: No acute abnormality. Musculoskeletal: There are significant degenerative changes in the thoracic and UPPER lumbar spine. No suspicious lytic or blastic lesions are identified. Review of the MIP images confirms the above findings. IMPRESSION: 1. Technically adequate exam showing no acute pulmonary embolus. 2. Coronary artery disease. 3.  Aortic atherosclerosis.  (ICD10-I70.0) 4. Trace bilateral pleural effusions.  Lungs are clear. Electronically Signed   By: Norva Pavlov M.D.   On: 09/29/2017 12:03   US Carotid Bilateral  Result Date: 09/28/2017 CLINICAL DATA:  85 year old female with syncope EXAM: BILATERAL CAROTID DUPLEX ULTRASOUND TECHNIQUE: Wallace Cullens scale imaging, color Doppler and duplex ultrasound were performed of bilateral carotid and vertebral arteries in the neck. COMPARISON:  None. FINDINGS: Criteria: Quantification of carotid stenosis is based on velocity parameters that correlate the residual internal carotid diameter with NASCET-based stenosis levels, using the diameter of the distal internal carotid lumen as the denominator for stenosis measurement. The following  velocity measurements were obtained: RIGHT ICA: 111/26 cm/sec CCA: 92/16 cm/sec SYSTOLIC ICA/CCA RATIO:  1.2 ECA:  92 cm/sec LEFT ICA: 87/22 cm/sec CCA: 104/18 cm/sec SYSTOLIC ICA/CCA RATIO:  0.8 ECA:  67 cm/sec RIGHT CAROTID ARTERY: Trace heterogeneous atherosclerotic plaque in the proximal internal carotid artery. By peak systolic velocity criteria, the estimated stenosis is less than 50%. RIGHT VERTEBRAL ARTERY:  Patent with normal antegrade flow. LEFT CAROTID ARTERY: Trace heterogeneous atherosclerotic plaque in the proximal internal carotid artery. By peak systolic velocity criteria, the estimated stenosis remains less than 50%. LEFT VERTEBRAL ARTERY:  Patent with normal antegrade flow. IMPRESSION: 1. Mild (1-49%) stenosis proximal right internal carotid artery secondary to trace  heterogeneous atherosclerotic plaque. 2. Mild (1-49%) stenosis proximal left internal carotid artery secondary to trace heterogeneous atherosclerotic plaque. 3. Vertebral arteries are patent with normal antegrade flow. Signed, Sterling BigHeath K. McCullough, MD [facility] Electronically Signed   By: Malachy MoanHeath  McCullough M.D.   On: 09/28/2017 16:16   [place] Venous Img Lower Bilateral  Result Date: 09/29/2017 CLINICAL DATA:  Elevated D-dimer edema. EXAM: BILATERAL LOWER EXTREMITY VENOUS DOPPLER ULTRASOUND TECHNIQUE: Gray-scale sonography with compression, as well as color and duplex ultrasound, were performed to evaluate the deep venous system from the level of the common femoral vein through the popliteal and proximal calf veins. COMPARISON:  None FINDINGS: Normal compressibility of the common femoral, superficial femoral, and popliteal veins, as well as the proximal calf veins. No filling defects to suggest DVT on grayscale or color Doppler imaging. Doppler waveforms show normal direction of venous flow, normal respiratory phasicity and response to augmentation. There is a 5.7 x 1.4 x 4.4 cm minimally complex cystic collection in the right posterior popliteal fossa. There is a 6.6 x 2.8 x 3.2 cm minimally complex cystic collection in the left posterior popliteal fossa. IMPRESSION: 1. No evidence of lower extremity deep vein thrombosis, bilaterally. 2. Bilateral Baker's cysts. Electronically Signed   By: Corlis Leak  Hassell M.D.   On: 09/29/2017 11:40     Management plans discussed with the patient, daughter and they are in agreement.  CODE STATUS: Full Code   TOTAL TIME TAKING CARE OF THIS PATIENT: 43 minutes.    Shaune PollackQing Lahari Suttles M.D on 09/29/2017 at 1:04 PM  Between 7am to 6pm - Pager - [phone]  After 6pm go to [email or web address] - Social research officer, [email or web address] [email or web address]  [facility] [place] [facility]  Office  [phone]  CC: Primary care physician; System, Pcp Not  In   Note: This dictation was prepared with Dragon dictation along with smaller phrase technology. Any transcriptional errors that result from this process are unintentional.

## 2017-09-29 NOTE — Progress Notes (Signed)
Present to place PIV.  Pt gone to ultrasound not in room.  Floor RN to place new consult when Pt. Back in room.  Will follow up then.

## 2017-09-29 NOTE — Progress Notes (Signed)
Patient Name: Kristen Conway Date of Encounter: 09/29/2017  Hospital Problem List     Active Problems:   Syncope    Patient Profile     Patient admitted after an apparent syncopal episode.  Work-up is been negative thus far.  Work-up for pulmonary embolus was negative.  D-dimer is markedly elevated.  Echo revealed preserved LV function with no significant wall motion abnormality or valvular disease.  There is been no arrhythmia noted.  Asymptomatic at present.  She had a similar event several years ago with a negative neurologic, cardiac work-up.  Subjective   Asymptomatic  Inpatient Medications    . aspirin EC  81 mg Oral Daily  . cholecalciferol  1,000 Units Oral Daily  . docusate sodium  100 mg Oral BID  . metoprolol succinate  50 mg Oral Daily  . multivitamin with minerals  1 tablet Oral Daily  . omega-3 acid ethyl esters  1 g Oral Daily  . vitamin C  500 mg Oral Daily    Vital Signs    Vitals:   09/29/17 0346 09/29/17 0730 09/29/17 0823 09/29/17 0825  BP: 139/84 (!) 176/86 (!) 178/87 (!) 160/87  Pulse: 70 73 72 71  Resp: 18     Temp: 98.4 F (36.9 C) 98.5 F (36.9 C)    TempSrc: Oral Oral    SpO2: 99% 99%    Weight: 73.4 kg (161 lb 14.4 oz)     Height:        Intake/Output Summary (Last 24 hours) at 09/29/2017 1316 Last data filed at 09/29/2017 1024 Gross per 24 hour  Intake 1080 ml  Output 1200 ml  Net -120 ml   Filed Weights   09/28/17 0015 09/28/17 0449 09/29/17 0346  Weight: 73.5 kg (162 lb) 73.3 kg (161 lb 11.2 oz) 73.4 kg (161 lb 14.4 oz)    Physical Exam    GEN: Well nourished, well developed, in no acute distress.  HEENT: normal.  Neck: Supple, no JVD, carotid bruits, or masses. Cardiac: RRR, no murmurs, rubs, or gallops. No clubbing, cyanosis, edema.  Radials/DP/PT 2+ and equal bilaterally.  Respiratory:  Respirations regular and unlabored, clear to auscultation bilaterally. GI: Soft, nontender, nondistended, BS + x 4. MS: no deformity  or atrophy. Skin: warm and dry, no rash. Neuro:  Strength and sensation are intact. Psych: Normal affect.  Labs    CBC Recent Labs    09/28/17 0021  WBC 6.1  NEUTROABS 3.8  HGB 11.5*  HCT 34.4*  MCV 88.1  PLT 161   Basic Metabolic Panel Recent Labs    16/10/96 0021 09/28/17 0346 09/29/17 0353  NA 140  --  139  K 3.5  --  3.9  CL 106  --  108  CO2 24  --  25  GLUCOSE 130*  --  85  BUN 25*  --  18  CREATININE 0.71  --  0.70  CALCIUM 9.4  --  8.5*  MG  --  1.8  --    Liver Function Tests Recent Labs    09/28/17 0021  AST 21  ALT 11  ALKPHOS 46  BILITOT 0.3  PROT 6.6  ALBUMIN 3.7   Recent Labs    09/28/17 0021  LIPASE 37   Cardiac Enzymes Recent Labs    09/28/17 1605 09/28/17 2217 09/29/17 0353  TROPONINI 0.10* 0.09* 0.09*   BNP No results for input(s): BNP in the last 72 hours. D-Dimer No results for input(s): DDIMER in the  last 72 hours. Hemoglobin A1C Recent Labs    09/28/17 0021  HGBA1C 5.3   Fasting Lipid Panel No results for input(s): CHOL, HDL, LDLCALC, TRIG, CHOLHDL, LDLDIRECT in the last 72 hours. Thyroid Function Tests Recent Labs    09/28/17 0021  TSH 5.962*    Telemetry    Normal sinus rhythm with no ischemia or arrhythmia  ECG    Normal sinus rhythm with no ischemia  Radiology    Ct Angio Chest Pe W Or Wo Contrast  Result Date: 09/29/2017 CLINICAL DATA:  Syncopal episode 1 day ago. EXAM: CT ANGIOGRAPHY CHEST WITH CONTRAST TECHNIQUE: Multidetector CT imaging of the chest was performed using the standard protocol during bolus administration of intravenous contrast. Multiplanar CT image reconstructions and MIPs were obtained to evaluate the vascular anatomy. CONTRAST:  75mL OMNIPAQUE IOHEXOL 350 MG/ML SOLN COMPARISON:  None. FINDINGS: Cardiovascular: Pulmonary arteries are well opacified by contrast bolus. There is no acute pulmonary embolus. Bovine arch anatomy. There is atherosclerotic calcification of the thoracic  aorta. No aneurysm. The heart size is normal. There is focal atherosclerotic calcification of the coronary arteries. Mediastinum/Nodes: The visualized portion of the thyroid gland has a normal appearance. Esophagus is normal. No mediastinal, hilar, or axillary adenopathy. Lungs/Pleura: There are trace bilateral pleural effusions. Lungs are clear. Airways are patent. Upper Abdomen: No acute abnormality. Musculoskeletal: There are significant degenerative changes in the thoracic and UPPER lumbar spine. No suspicious lytic or blastic lesions are identified. Review of the MIP images confirms the above findings. IMPRESSION: 1. Technically adequate exam showing no acute pulmonary embolus. 2. Coronary artery disease. 3.  Aortic atherosclerosis.  (ICD10-I70.0) 4. Trace bilateral pleural effusions.  Lungs are clear. Electronically Signed   By: Norva PavlovElizabeth  Brown M.D.   On: 09/29/2017 12:03   [place] Carotid Bilateral  Result Date: 09/28/2017 CLINICAL DATA:  84 year old female with syncope EXAM: BILATERAL CAROTID DUPLEX ULTRASOUND TECHNIQUE: Wallace CullensGray scale imaging, color Doppler and duplex ultrasound were performed of bilateral carotid and vertebral arteries in the neck. COMPARISON:  None. FINDINGS: Criteria: Quantification of carotid stenosis is based on velocity parameters that correlate the residual internal carotid diameter with NASCET-based stenosis levels, using the diameter of the distal internal carotid lumen as the denominator for stenosis measurement. The following velocity measurements were obtained: RIGHT ICA: 111/26 cm/sec CCA: 92/16 cm/sec SYSTOLIC ICA/CCA RATIO:  1.2 ECA:  92 cm/sec LEFT ICA: 87/22 cm/sec CCA: 104/18 cm/sec SYSTOLIC ICA/CCA RATIO:  0.8 ECA:  67 cm/sec RIGHT CAROTID ARTERY: Trace heterogeneous atherosclerotic plaque in the proximal internal carotid artery. By peak systolic velocity criteria, the estimated stenosis is less than 50%. RIGHT VERTEBRAL ARTERY:  Patent with normal antegrade flow. LEFT  CAROTID ARTERY: Trace heterogeneous atherosclerotic plaque in the proximal internal carotid artery. By peak systolic velocity criteria, the estimated stenosis remains less than 50%. LEFT VERTEBRAL ARTERY:  Patent with normal antegrade flow. IMPRESSION: 1. Mild (1-49%) stenosis proximal right internal carotid artery secondary to trace heterogeneous atherosclerotic plaque. 2. Mild (1-49%) stenosis proximal left internal carotid artery secondary to trace heterogeneous atherosclerotic plaque. 3. Vertebral arteries are patent with normal antegrade flow. Signed, Sterling BigHeath K. McCullough, MD [facility] Electronically Signed   By: Malachy MoanHeath  McCullough M.D.   On: 09/28/2017 16:16   [place] Venous Img Lower Bilateral  Result Date: 09/29/2017 CLINICAL DATA:  Elevated D-dimer edema. EXAM: BILATERAL LOWER EXTREMITY VENOUS DOPPLER ULTRASOUND TECHNIQUE: Gray-scale sonography with compression, as well as color and duplex ultrasound, were performed to evaluate the deep  venous system from the level of the common femoral vein through the popliteal and proximal calf veins. COMPARISON:  None FINDINGS: Normal compressibility of the common femoral, superficial femoral, and popliteal veins, as well as the proximal calf veins. No filling defects to suggest DVT on grayscale or color Doppler imaging. Doppler waveforms show normal direction of venous flow, normal respiratory phasicity and response to augmentation. There is a 5.7 x 1.4 x 4.4 cm minimally complex cystic collection in the right posterior popliteal fossa. There is a 6.6 x 2.8 x 3.2 cm minimally complex cystic collection in the left posterior popliteal fossa. IMPRESSION: 1. No evidence of lower extremity deep vein thrombosis, bilaterally. 2. Bilateral Baker's cysts. Electronically Signed   By: Corlis Leak M.D.   On: 09/29/2017 11:40    Assessment & Plan    Syncope-etiology unclear.  Patient has no chest pain.  Calcification in  the coronary arteries on CT with no evidence of significant ischemia clinically.  Duration regarding her syncopal episodes did not suggest ischemia.  Consideration for outpatient ischemic work-up could be raised however patient is asymptomatic at present.  No arrhythmia noted.  No structural heart disease noted.  No DVT.  Would continue with current medications and ambulate.  If stable consider discharge home with further work-up per her physicians in [place].  Signed, Darlin Priestly Terica Yogi MD 09/29/2017, 1:16 PM  Pager: [phone]

## 2017-09-29 NOTE — Discharge Instructions (Signed)
Syncope Syncope is when you temporarily lose consciousness. Syncope may also be called fainting or passing out. It is caused by a sudden decrease in blood flow to the brain. Even though most causes of syncope are not dangerous, syncope can be a sign of a serious medical problem. Signs that you may be about to faint include:  Feeling dizzy or light-headed.  Feeling nauseous.  Seeing all white or all black in your field of vision.  Having cold, clammy skin.  If you fainted, get medical help right away.Call your local emergency services (911 in the [place] Do not drive yourself to the hospital. Follow these instructions at home: Pay attention to any changes in your symptoms. Take these actions to help with your condition:  Have someone stay with you until you feel stable.  Do not drive, use machinery, or play sports until your health care provider says it is okay.  Keep all follow-up visits as told by your health care provider. This is important.  If you start to feel like you might faint, lie down right away and raise (elevate) your feet above the level of your heart. Breathe deeply and steadily. Wait until all of the symptoms have passed.  Drink enough fluid to keep your urine clear or pale yellow.  If you are taking blood pressure or heart medicine, get up slowly and take several minutes to sit and then stand. This can reduce dizziness.  Take over-the-counter and prescription medicines only as told by your health care provider.  Get help right away if:  You have a severe headache.  You have unusual pain in your chest, abdomen, or back.  You are bleeding from your mouth or rectum, or you have black or tarry stool.  You have a very fast or irregular heartbeat (palpitations).  You have pain with breathing.  You faint once or repeatedly.  You have a seizure.  You are confused.  You have trouble walking.  You have severe weakness.  You have vision problems. These  symptoms may represent a serious problem that is an emergency. Do not wait to see if your symptoms will go away. Get medical help right away. Call your local emergency services (911 in the [place] Do not drive yourself to the hospital. This information is not intended to replace advice given to you by your health care provider. Make sure you discuss any questions you have with your health care provider. Document Released: 03/21/2005 Document Revised: 08/27/2015 Document Reviewed: 12/03/2014 Elsevier Interactive Patient Education  2018 [employer]  

## 2019-12-06 IMAGING — CT CT ANGIO CHEST
2 of 6 series · 18 of 46 positions shown · IV contrast (APPLIED)
Comparison: None.

CLINICAL DATA: Syncopal episode 1 day ago.

EXAM:
CT ANGIOGRAPHY CHEST WITH CONTRAST
TECHNIQUE: Multidetector CT imaging of the chest was performed using the
standard protocol during bolus administration of intravenous
contrast. Multiplanar CT image reconstructions and MIPs were
obtained to evaluate the vascular anatomy.
CONTRAST:  75mL OMNIPAQUE IOHEXOL 350 MG/ML SOLN

[Series 5: thins · axial · 0.66mm/px · z∈[-241,-23]mm · 16 of 240 slices shown]
[im 11/240  lung]
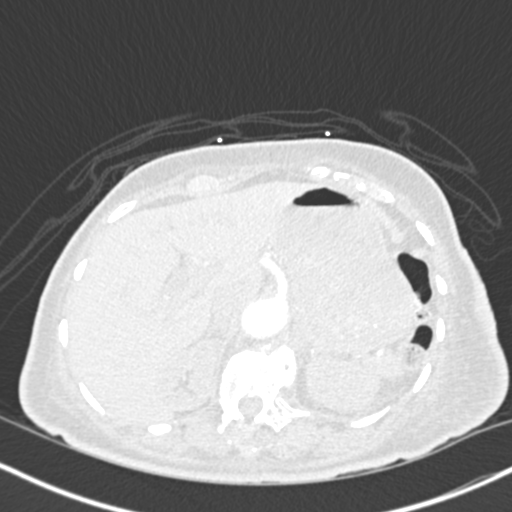
[im 32/240  soft-tissue]
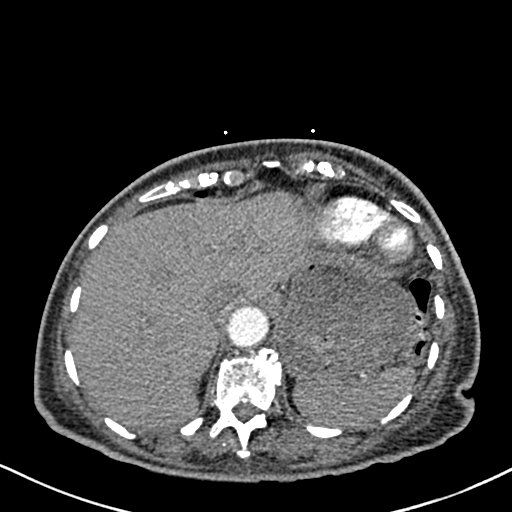
[im 42/240  lung]
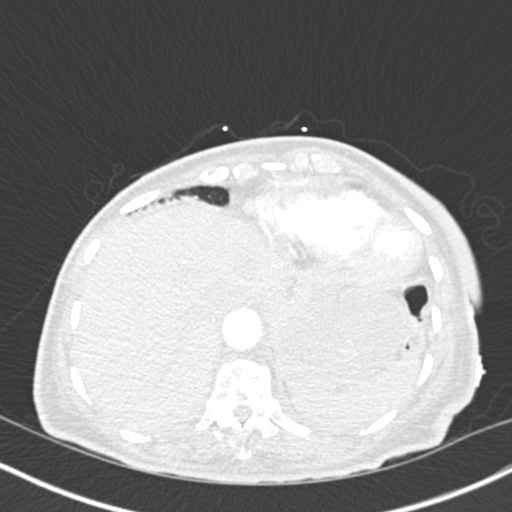
[im 52/240  soft-tissue]
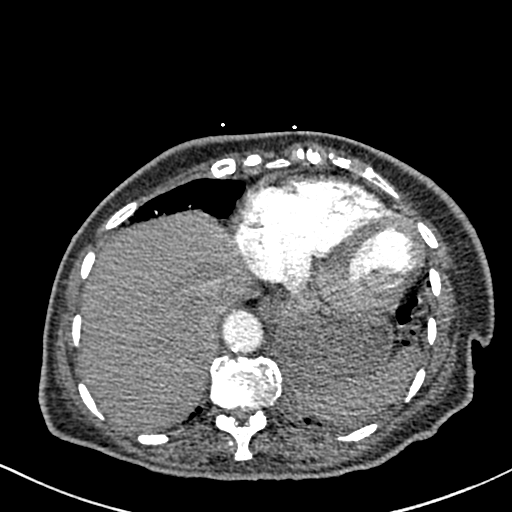
[im 73/240  lung]
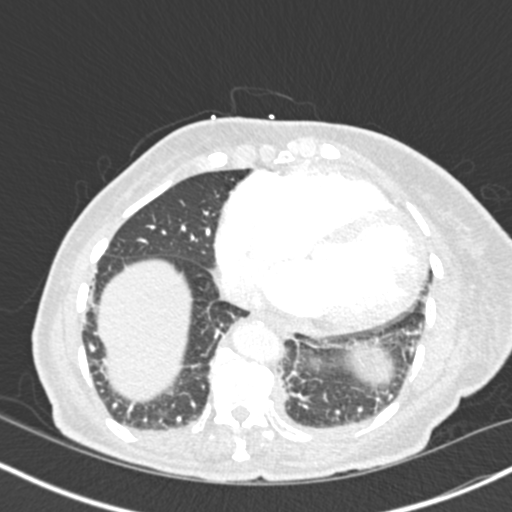
[im 84/240  soft-tissue]
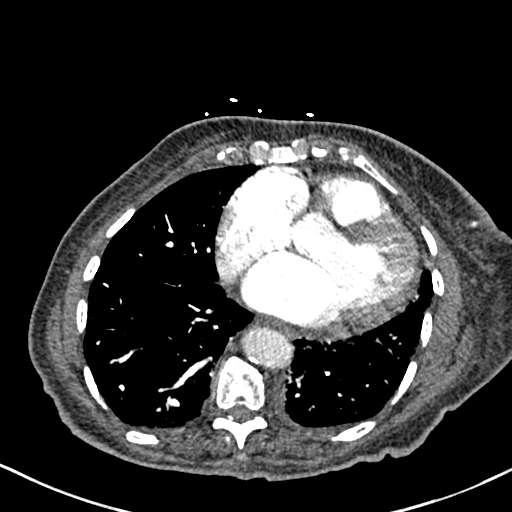
[im 94/240  lung]
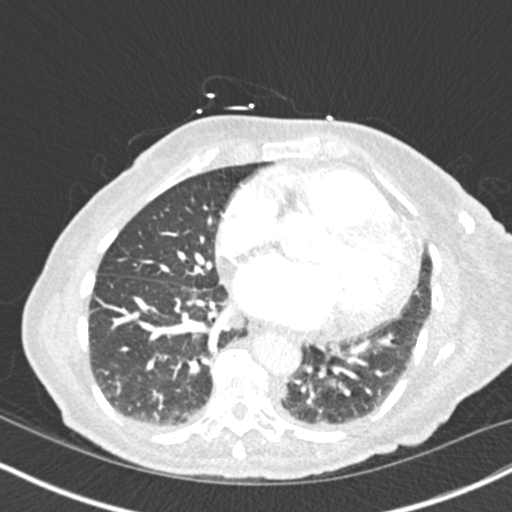
[im 115/240  soft-tissue]
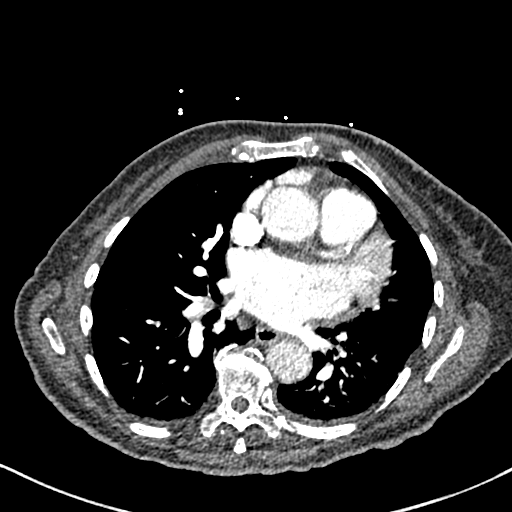
[im 125/240  lung]
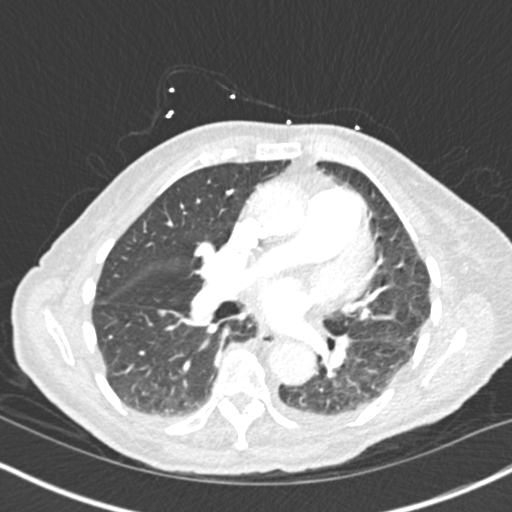
[im 146/240  soft-tissue]
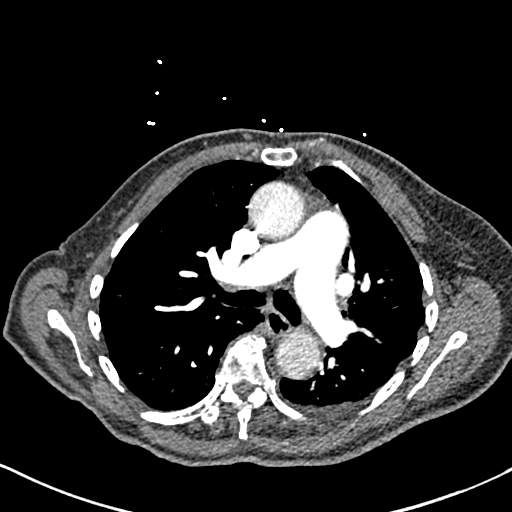
[im 156/240  lung]
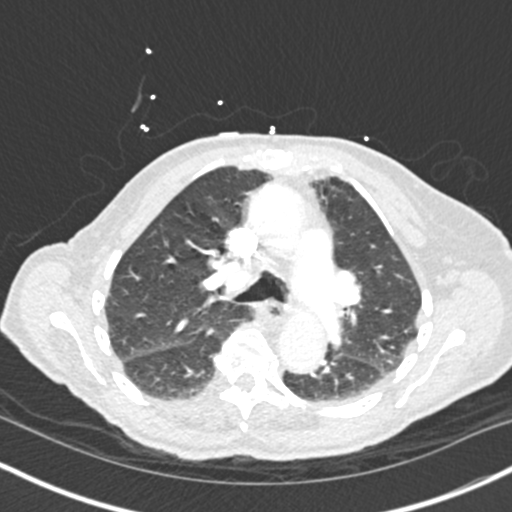
[im 167/240  soft-tissue]
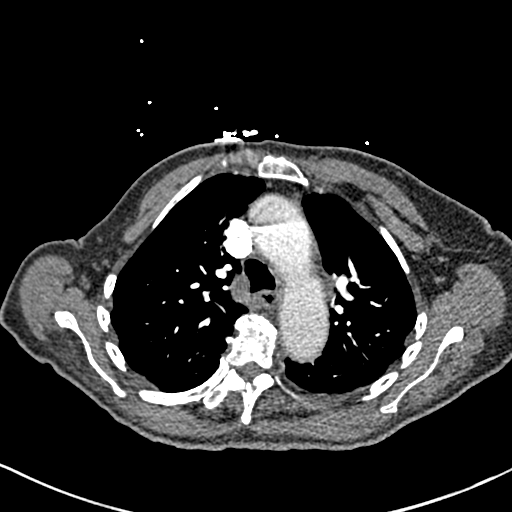
[im 188/240  lung]
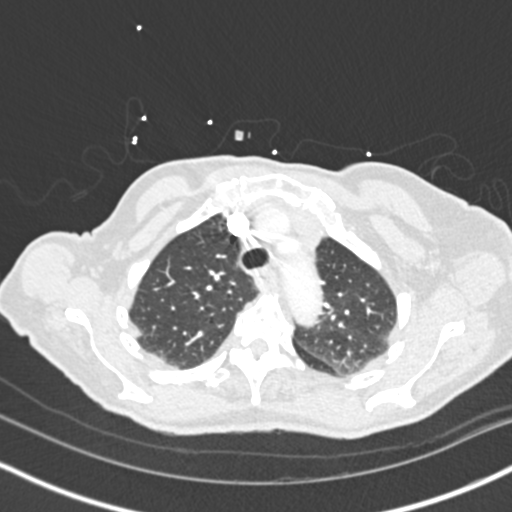
[im 198/240  soft-tissue]
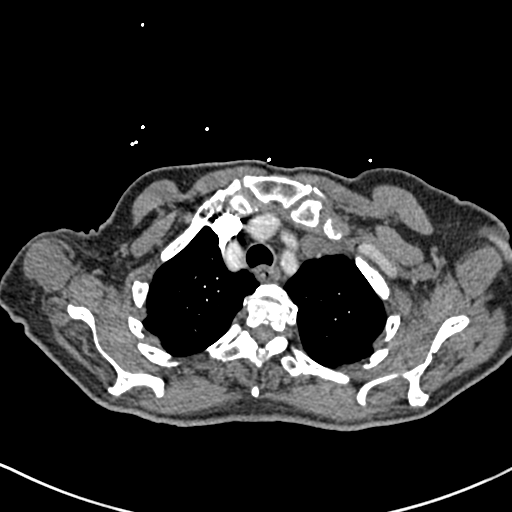
[im 208/240  lung]
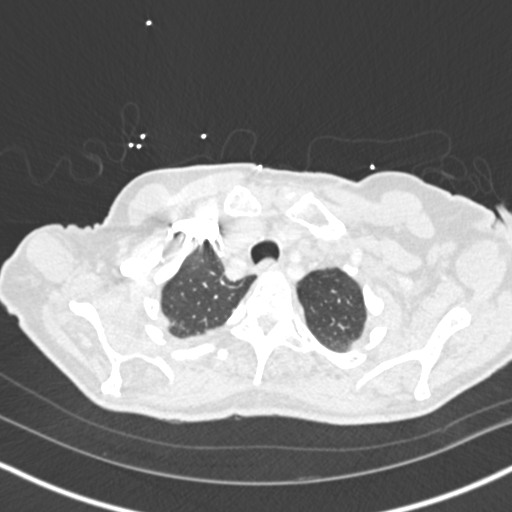
[im 229/240  soft-tissue]
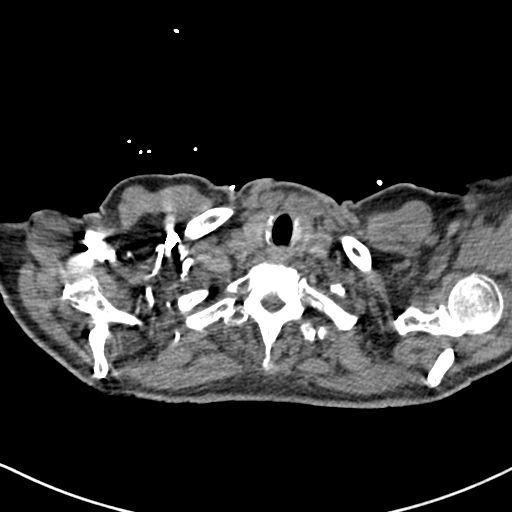

[Series 7: coronal mpr · coronal · 0.51mm/px · 2 of 81 slices shown]
[im 27/81  soft-tissue]
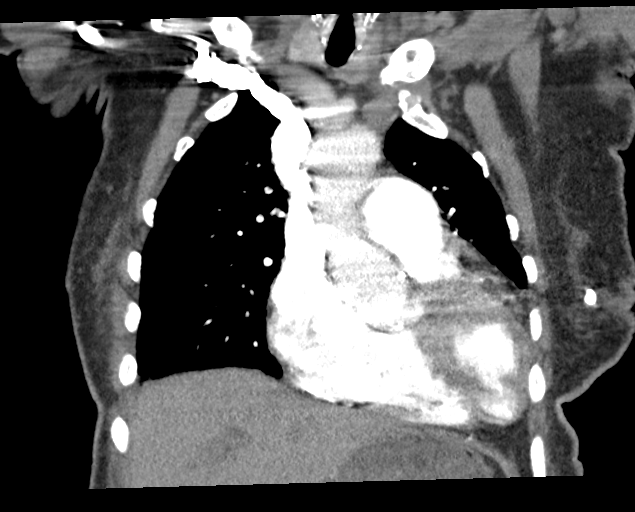
[im 54/81  soft-tissue]
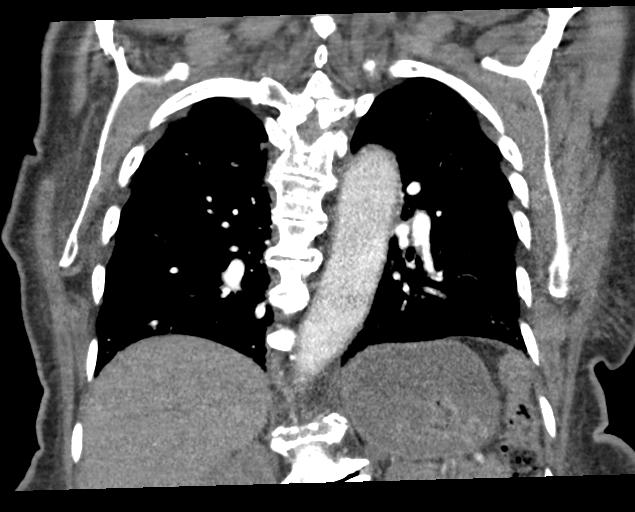

[18 of 46 positions shown; findings below may reference images not displayed]

FINDINGS: Cardiovascular: Pulmonary arteries are well opacified by contrast
bolus. There is no acute pulmonary embolus. Bovine arch anatomy.
There is atherosclerotic calcification of the thoracic aorta. No
aneurysm. The heart size is normal. There is focal atherosclerotic
calcification of the coronary arteries.

Mediastinum/Nodes: The visualized portion of the thyroid gland has a
normal appearance. Esophagus is normal. No mediastinal, hilar, or
axillary adenopathy.

Lungs/Pleura: There are trace bilateral pleural effusions. Lungs are
clear. Airways are patent.

Upper Abdomen: No acute abnormality.

Musculoskeletal: There are significant degenerative changes in the
thoracic and UPPER lumbar spine. No suspicious lytic or blastic
lesions are identified.

Review of the MIP images confirms the above findings.
IMPRESSION: 1. Technically adequate exam showing no acute pulmonary embolus.
2. Coronary artery disease.
3.  Aortic atherosclerosis.  ([id number])
4. Trace bilateral pleural effusions.  Lungs are clear.

## 2023-06-03 DEATH — deceased
